# Patient Record
Sex: Female | Born: 1965 | Race: Black or African American | Hispanic: No | Marital: Single | Smoking: Never smoker
Health system: Southern US, Community
[De-identification: ages and names within clinical notes are randomized; demographics above are authoritative.]

## PROBLEM LIST (undated history)

## (undated) HISTORY — PX: TONSILLECTOMY: SUR1361

## (undated) HISTORY — PX: OVARY SURGERY: SHX727

---

## 2001-12-25 ENCOUNTER — Other Ambulatory Visit: Admission: RE | Admit: 2001-12-25 | Discharge: 2001-12-25 | Payer: Self-pay | Admitting: Gynecology

## 2002-11-10 ENCOUNTER — Other Ambulatory Visit: Admission: RE | Admit: 2002-11-10 | Discharge: 2002-11-10 | Payer: Self-pay | Admitting: Obstetrics and Gynecology

## 2004-03-03 ENCOUNTER — Other Ambulatory Visit: Admission: RE | Admit: 2004-03-03 | Discharge: 2004-03-03 | Payer: Self-pay | Admitting: Obstetrics and Gynecology

## 2004-03-17 ENCOUNTER — Ambulatory Visit (HOSPITAL_COMMUNITY): Admission: RE | Admit: 2004-03-17 | Discharge: 2004-03-17 | Payer: Self-pay | Admitting: Obstetrics and Gynecology

## 2004-03-31 ENCOUNTER — Ambulatory Visit (HOSPITAL_COMMUNITY): Admission: RE | Admit: 2004-03-31 | Discharge: 2004-03-31 | Payer: Self-pay | Admitting: Obstetrics and Gynecology

## 2004-03-31 ENCOUNTER — Encounter (INDEPENDENT_AMBULATORY_CARE_PROVIDER_SITE_OTHER): Payer: Self-pay | Admitting: *Deleted

## 2004-09-13 ENCOUNTER — Encounter (INDEPENDENT_AMBULATORY_CARE_PROVIDER_SITE_OTHER): Payer: Self-pay | Admitting: *Deleted

## 2004-09-13 ENCOUNTER — Ambulatory Visit (HOSPITAL_COMMUNITY): Admission: RE | Admit: 2004-09-13 | Discharge: 2004-09-13 | Payer: Self-pay | Admitting: Obstetrics and Gynecology

## 2005-12-25 ENCOUNTER — Ambulatory Visit (HOSPITAL_COMMUNITY): Admission: RE | Admit: 2005-12-25 | Discharge: 2005-12-25 | Payer: Self-pay | Admitting: Obstetrics and Gynecology

## 2007-03-08 ENCOUNTER — Encounter: Admission: RE | Admit: 2007-03-08 | Discharge: 2007-03-08 | Payer: Self-pay | Admitting: Physician Assistant

## 2008-07-10 ENCOUNTER — Ambulatory Visit (HOSPITAL_COMMUNITY): Admission: RE | Admit: 2008-07-10 | Discharge: 2008-07-10 | Payer: Self-pay | Admitting: Obstetrics and Gynecology

## 2008-10-22 ENCOUNTER — Ambulatory Visit (HOSPITAL_COMMUNITY): Admission: RE | Admit: 2008-10-22 | Discharge: 2008-10-22 | Payer: Self-pay | Admitting: Gastroenterology

## 2010-02-05 ENCOUNTER — Encounter: Payer: Self-pay | Admitting: Obstetrics and Gynecology

## 2010-04-25 LAB — CBC
MCHC: 33.7 g/dL (ref 30.0–36.0)
MCV: 87.3 fL (ref 78.0–100.0)
RBC: 4.27 MIL/uL (ref 3.87–5.11)
RDW: 13.8 % (ref 11.5–15.5)

## 2010-04-25 LAB — PREGNANCY, URINE: Preg Test, Ur: NEGATIVE

## 2010-05-31 NOTE — Op Note (Signed)
NAME:  Leah Hanson, Leah Hanson                 ACCOUNT NO.:  [id number]   MEDICAL RECORD NO.:  [id number]          PATIENT TYPE:  AMB   LOCATION:  SDC                           FACILITY:  [facility]   PHYSICIAN:  Michelle L. Grewal, M.D.DATE OF BIRTH:  11/26/1965   DATE OF PROCEDURE:  07/10/2008  DATE OF DISCHARGE:  07/10/2008                               OPERATIVE REPORT   PREOPERATIVE DIAGNOSIS:  Right lower quadrant pain.   POSTOPERATIVE DIAGNOSES:  Right lower quadrant pain and uterine fibroids  postop.   PROCEDURE:  Diagnostic laparoscopy and lysis of adhesion.   SURGEON:  Michelle L. Vincente Poli, MD   ANESTHESIA:  General.   FINDINGS:  1. Peritoneal inclusion cyst in the pelvis.  2. Uterine fibroids.  3. One adhesions between the right ovary and tube which was lysed.   PATHOLOGY:  None.   ESTIMATED BLOOD LOSS:  Minimal.   COMPLICATIONS:  None.   PROCEDURE:  The patient was taken to the operating room after informed  consent was obtained.  She was then prepped and draped in the usual  sterile fashion after she was intubated.  A time-out was performed.  A  uterine manipulator was inserted.  The bladder was emptied using an in-  and-out catheter.  The attention was turned to the abdomen.  A small  infraumbilical incision was made after local was infiltrated and the  Veress needle was inserted.  Pneumoperitoneum was then performed.  The  Veress needle was removed.  The 11-mm trocar was inserted.  The  laparoscope was introduced through the trocar sheaths.  The patient was  placed in Trendelenburg position.  Upper abdomen appeared normal.  There  were no omental adhesions.  The bowel appeared normal.  Left ovary and  tube was absent.  Her uterus was enlarged because she had fibroids.  A  secondary trocar was inserted using a 5-mm trocar under direct  visualization.  This was inserted suprapubically.  That way, I could  insert the way probe, so I could look in the cul-de-sac and look around  the right ovary.  I inspected the right tube and ovary carefully.  The  ovary itself looked normal and there was a small adhesion band between  the right fallopian tube and the right ovary which I cut without  difficulty.  This could potentially be a cause for her pain.  I then  looked in the cul-de-sac and she had some peritoneal inclusion cyst  there which I then drained using the Kleppinger by grasping that.  No  evidence of  endometriosis was noted.  The pneumoperitoneum was released.  The  trocars were removed.  The skin was closed with using 3-0 Vicryl  interrupted.  All sponge, lap, and instrument counts were correct x2.  All instruments were removed from the vagina.  Of course, the patient  was extubated and went to recovery room in stable condition.      Michelle L. Vincente Poli, M.D.  Electronically Signed     MLG/MEDQ  D:  07/10/2008  T:  07/11/2008  Job:  [id number]

## 2010-06-03 NOTE — Op Note (Signed)
NAME:  Leah Hanson, Leah Hanson                 ACCOUNT NO.:  [id number]   MEDICAL RECORD NO.:  [id number]          PATIENT TYPE:  AMB   LOCATION:  SDC                           FACILITY:  [facility]   PHYSICIAN:  Michelle L. Grewal, M.D.DATE OF BIRTH:  1965/07/12   DATE OF PROCEDURE:  09/13/2004  DATE OF DISCHARGE:                                 OPERATIVE REPORT   PREOPERATIVE DIAGNOSIS:  Pelvic pain, right ovarian cyst.   POSTOPERATIVE DIAGNOSIS:  Pelvic pain, right ovarian cyst.  Plus omental  adhesions.   PROCEDURE:  Diagnostic laparoscopy, drainage of right ovarian cyst, lysis of  adhesions, and peritoneal cyst biopsy.   SURGEON:  Marcelle Overlie, M.D.   ANESTHESIA:  General.   SPECIMENS:  Peritoneal biopsy.   ESTIMATED BLOOD LOSS:  Minimal.   COMPLICATIONS:  None.   DESCRIPTION OF PROCEDURE:  The patient is taken to the operating room.  She  is intubated without difficulty.  She is prepped and draped in the usual  sterile fashion.  Uterine manipulator is inserted into the uterus.  A small  infraumbilical incision was made.  The Veress needle was inserted and CO2  was inserted.  The Veress needle was removed and an 11 mm trocar was  inserted and the patient was placed in Trendelenburg position.  I then  inserted a 5 mm trocar suprapubically under direct visualization.  At this  point, I visualized the right tube and ovary.  The right ovary was enlarged  due to what appeared to be a hemorrhagic cyst.  No endometriosis seen.  The  uterus was slightly enlarged, but I could not discern any subserosal  fibroids.  There was a large amount of cobweb omental adhesions all down the  left upper quadrant and left lower quadrant down over the left pelvic side  wall which I then lysed those completely using the Gyrus with excellent  hemostasis.  The left ovary was absent as well as the left tube.  I then  drained the right ovarian cyst using Kleppingers, opened it up.  It was  hemorrhagic material  that was noted.  I burned the base of the cyst and  irrigated it.  It was dry.  I noticed that at the level of the left round  ligament insertion in the inguinal canal there were some peritoneal  inclusion cysts, but I did biopsy one of them as well just to make sure  there was no remnant of the ovary.  At the end of the procedure, hemostasis  was noted.  All adhesions were  lysed.  Again no endometriosis noted.  The pneumoperitoneum was released.  The trocars were removed from the abdominal cavity and incisions were closed  using Dermabond.  All needle, sponge, and instrument counts correct x2.  The  patient went to the recovery room in stable condition.      Michelle L. Vincente Poli, M.D.  Electronically Signed     MLG/MEDQ  D:  09/13/2004  T:  09/13/2004  Job:  [id number]

## 2013-06-03 ENCOUNTER — Other Ambulatory Visit: Payer: Self-pay | Admitting: Obstetrics and Gynecology

## 2013-06-24 ENCOUNTER — Other Ambulatory Visit: Payer: Self-pay | Admitting: Obstetrics and Gynecology

## 2013-06-24 DIAGNOSIS — R928 Other abnormal and inconclusive findings on diagnostic imaging of breast: Secondary | ICD-10-CM

## 2013-07-30 ENCOUNTER — Ambulatory Visit
Admission: RE | Admit: 2013-07-30 | Discharge: 2013-07-30 | Disposition: A | Discharge status: Home / Self Care | Payer: Commercial Indemnity | Source: Ambulatory Visit | Attending: Obstetrics and Gynecology | Admitting: Obstetrics and Gynecology

## 2013-07-30 DIAGNOSIS — R928 Other abnormal and inconclusive findings on diagnostic imaging of breast: Secondary | ICD-10-CM

## 2016-02-02 IMAGING — MG MM DIGITAL DIAGNOSTIC UNILAT*R*
4 series · 4 of 4 positions shown · non-contrast
Comparison: Baseline mammogram 06/03/2013

CLINICAL DATA: Screening callback for questioned right breast
calcifications at baseline mammography

EXAM:
DIGITAL DIAGNOSTIC  right MAMMOGRAM

[R CC]
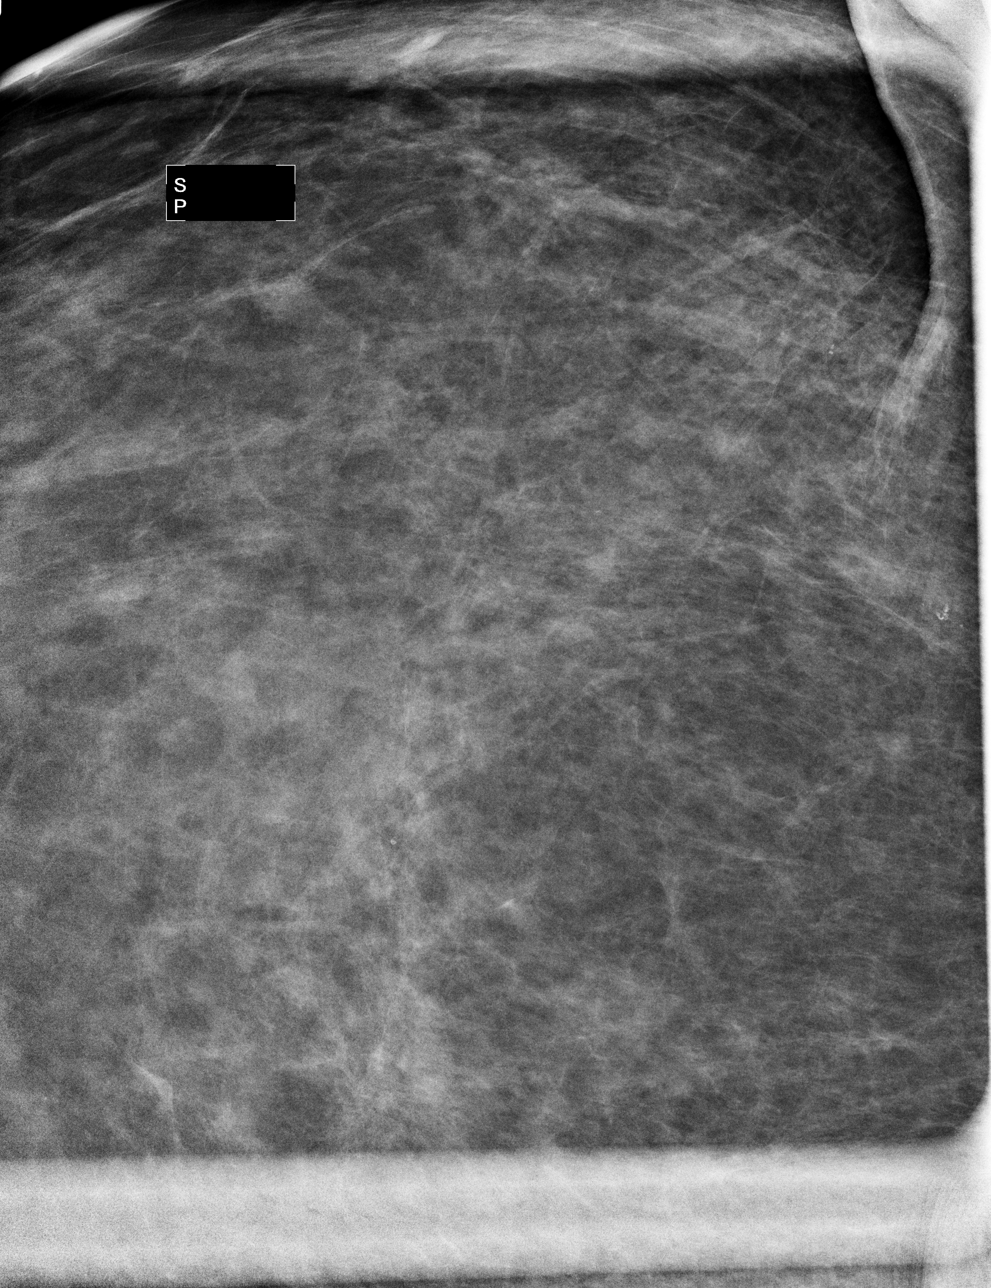

[R TAN]
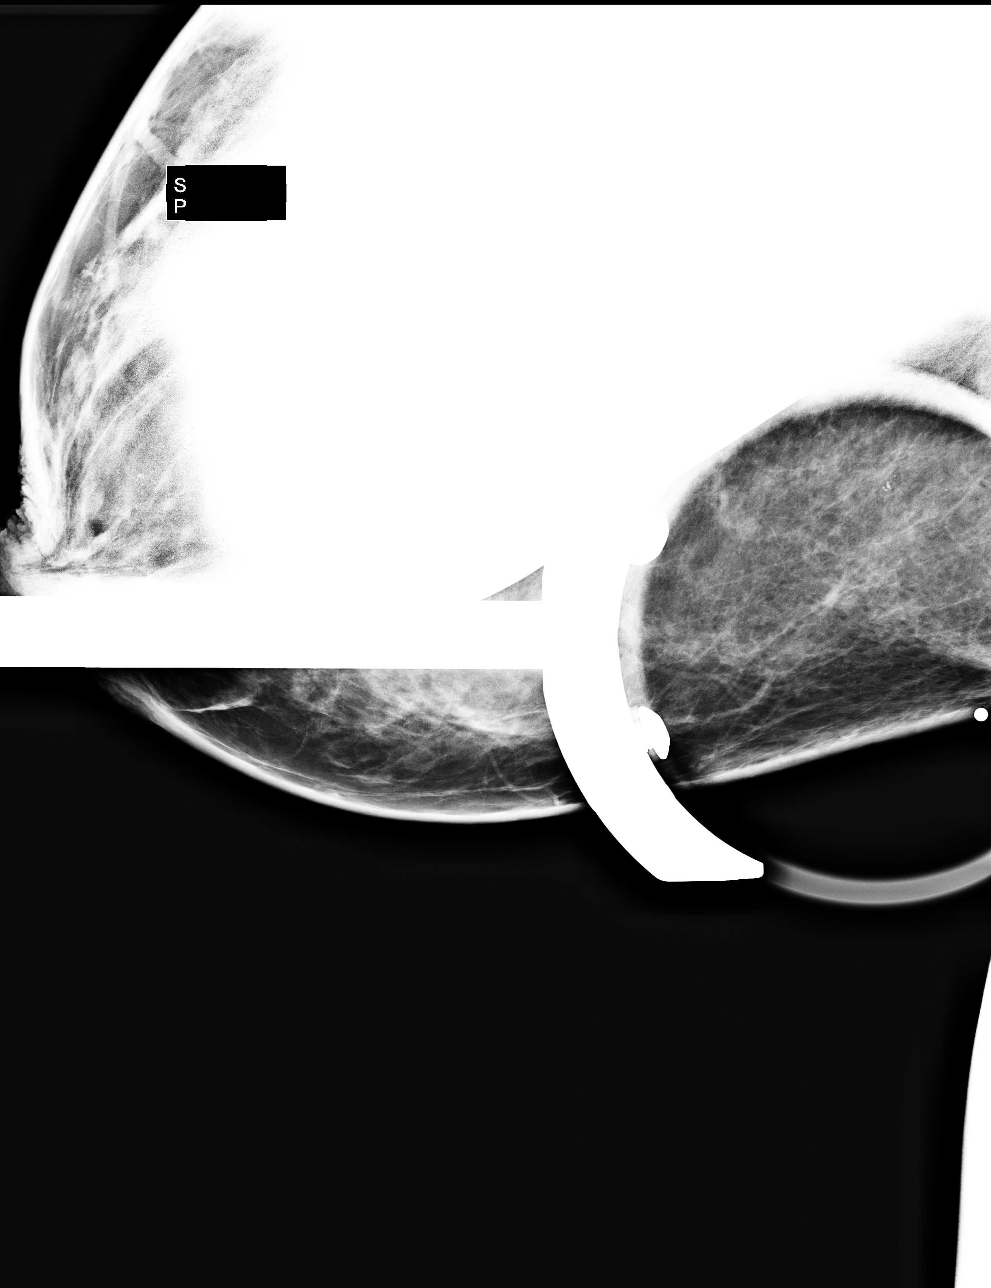

[R ML (1 of 2)]
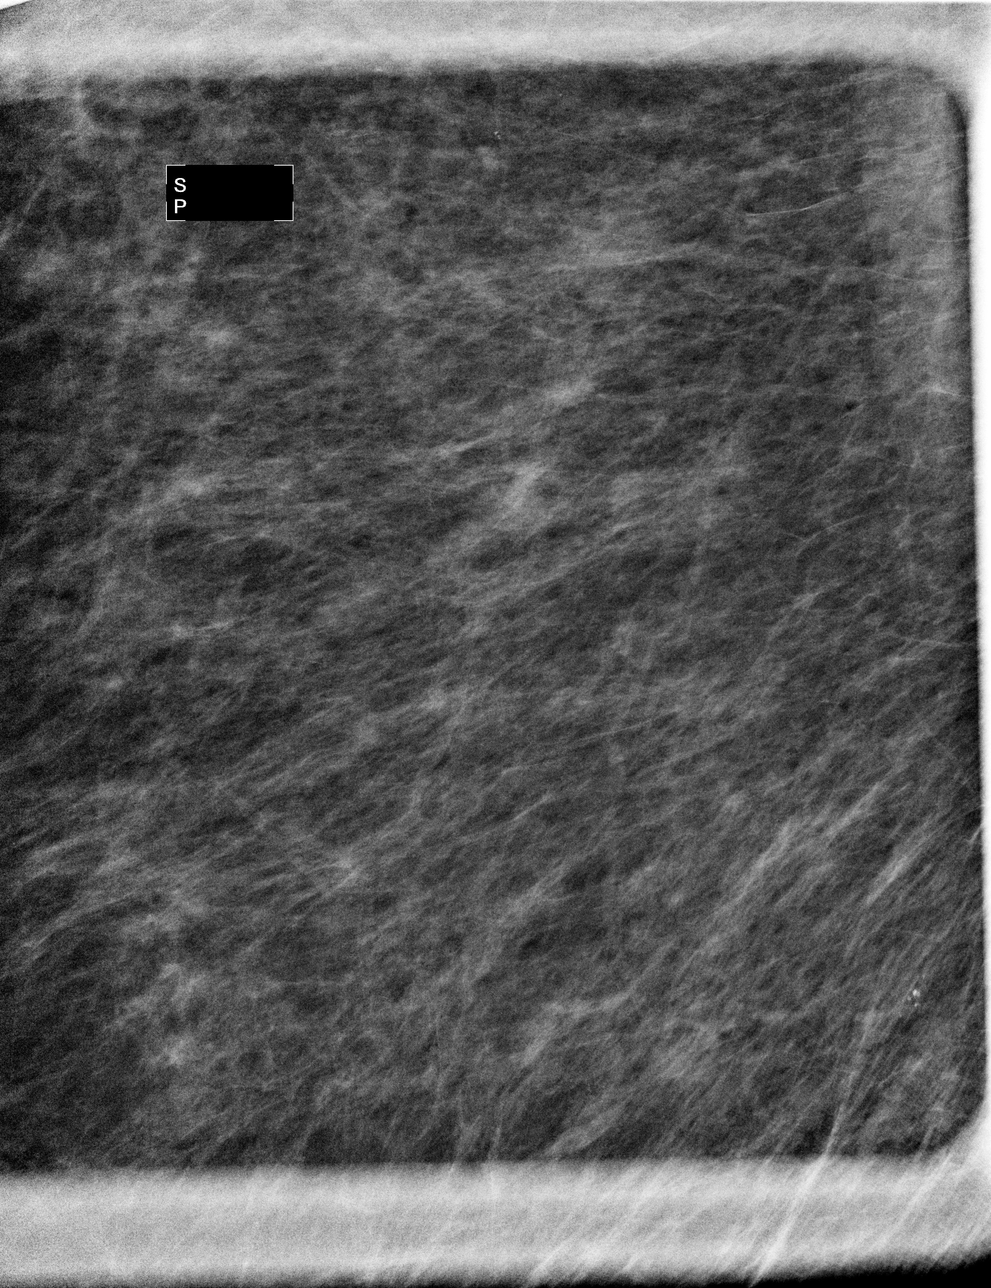

[R ML (2 of 2)]
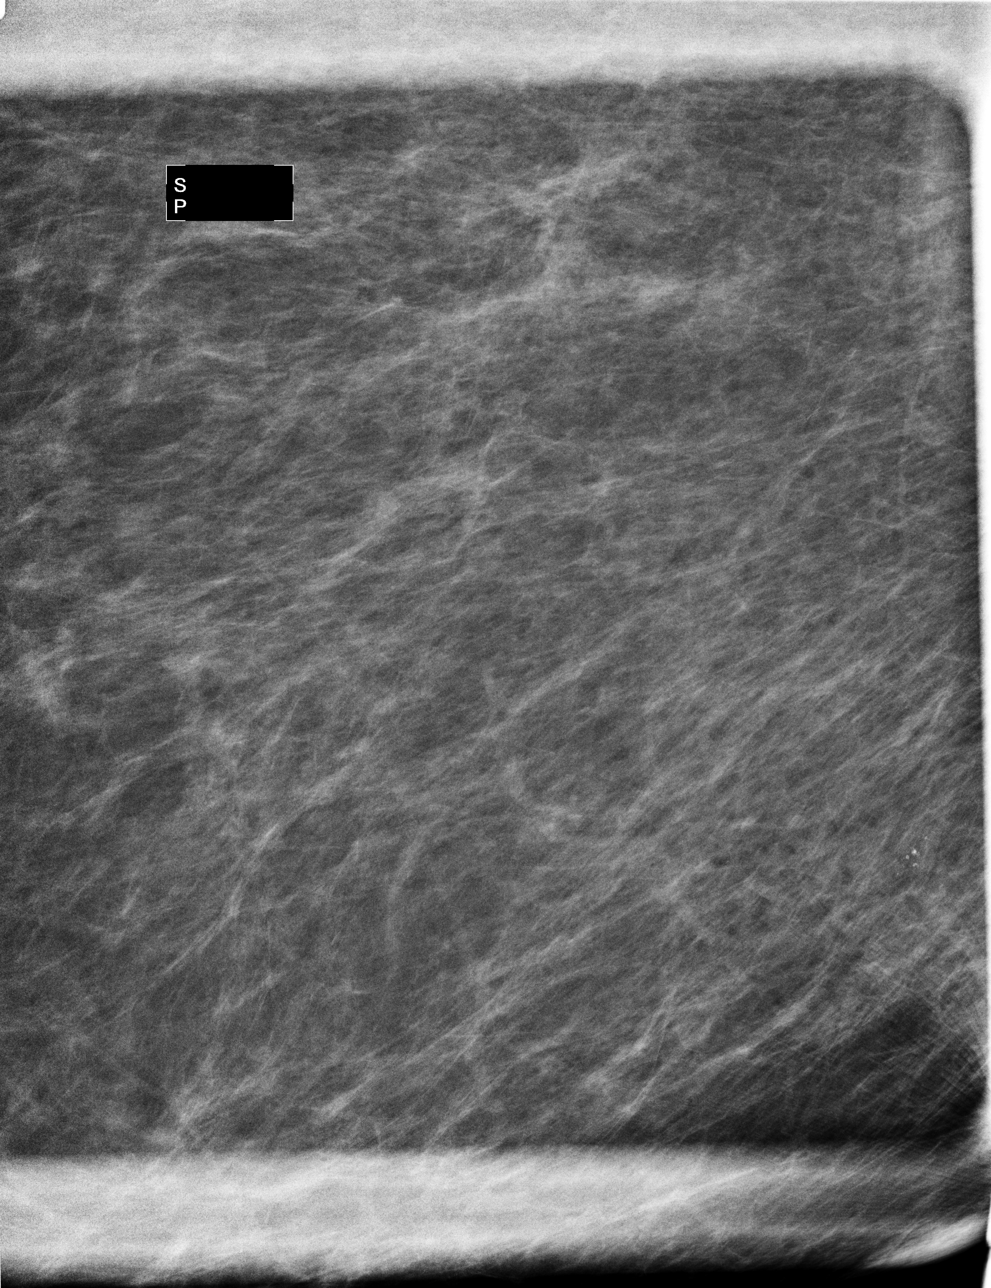

[4 of 4 positions shown; findings below may reference images not displayed]

ACR Breast Density Category c: The breast tissue is heterogeneously
dense, which may obscure small masses.
FINDINGS: Additional views confirm the presence of a cluster of round
calcifications in the right breast 6 o'clock location, most of which
demonstrate apparent lucent centers suggesting benignity. These were
not intradermal at dedicated imaging.
IMPRESSION: Probably benign right breast calcifications 6 o'clock location.
Options for short-term imaging followup and percutaneous core biopsy
were discussed and the patient chooses short-term followup imaging.

RECOMMENDATION:
Right diagnostic mammogram in 6 months

I have discussed the findings and recommendations with the patient.
Results were also provided in writing at the conclusion of the
visit. If applicable, a reminder letter will be sent to the patient
regarding the next appointment.

BI-RADS CATEGORY  3: Probably benign.

## 2016-08-09 ENCOUNTER — Encounter (HOSPITAL_BASED_OUTPATIENT_CLINIC_OR_DEPARTMENT_OTHER): Payer: Self-pay

## 2016-08-09 ENCOUNTER — Emergency Department (HOSPITAL_BASED_OUTPATIENT_CLINIC_OR_DEPARTMENT_OTHER)
Admission: EM | Admit: 2016-08-09 | Discharge: 2016-08-09 | Disposition: A | Discharge status: Home / Self Care | Payer: Commercial Managed Care - PPO | Attending: Emergency Medicine | Admitting: Emergency Medicine

## 2016-08-09 ENCOUNTER — Emergency Department (HOSPITAL_BASED_OUTPATIENT_CLINIC_OR_DEPARTMENT_OTHER): Payer: Commercial Managed Care - PPO

## 2016-08-09 DIAGNOSIS — N938 Other specified abnormal uterine and vaginal bleeding: Secondary | ICD-10-CM

## 2016-08-09 DIAGNOSIS — R1031 Right lower quadrant pain: Secondary | ICD-10-CM | POA: Diagnosis not present

## 2016-08-09 DIAGNOSIS — R9389 Abnormal findings on diagnostic imaging of other specified body structures: Secondary | ICD-10-CM

## 2016-08-09 DIAGNOSIS — D259 Leiomyoma of uterus, unspecified: Secondary | ICD-10-CM

## 2016-08-09 DIAGNOSIS — R102 Pelvic and perineal pain: Secondary | ICD-10-CM

## 2016-08-09 LAB — CBC
HEMATOCRIT: 33.7 % — AB (ref 36.0–46.0)
Hemoglobin: 11.2 g/dL — ABNORMAL LOW (ref 12.0–15.0)
MCH: 28.2 pg (ref 26.0–34.0)
MCHC: 33.2 g/dL (ref 30.0–36.0)
MCV: 84.9 fL (ref 78.0–100.0)
Platelets: 346 10*3/uL (ref 150–400)
RBC: 3.97 MIL/uL (ref 3.87–5.11)
RDW: 17.4 % — AB (ref 11.5–15.5)
WBC: 7.6 10*3/uL (ref 4.0–10.5)

## 2016-08-09 LAB — BASIC METABOLIC PANEL
ANION GAP: 8 (ref 5–15)
BUN: 10 mg/dL (ref 6–20)
CALCIUM: 8.8 mg/dL — AB (ref 8.9–10.3)
CO2: 28 mmol/L (ref 22–32)
Chloride: 103 mmol/L (ref 101–111)
Creatinine, Ser: 0.7 mg/dL (ref 0.44–1.00)
GFR calc Af Amer: >60 mL/min (ref >60)
GFR calc non Af Amer: >60 mL/min (ref >60)
GLUCOSE: 105 mg/dL — AB (ref 65–99)
Potassium: 4 mmol/L (ref 3.5–5.1)
Sodium: 139 mmol/L (ref 135–145)

## 2016-08-09 LAB — WET PREP, GENITAL
Sperm: NONE SEEN
Trich, Wet Prep: NONE SEEN
Yeast Wet Prep HPF POC: NONE SEEN

## 2016-08-09 LAB — PREGNANCY, URINE: Preg Test, Ur: NEGATIVE

## 2016-08-09 NOTE — ED Notes (Signed)
Pt resting.  Notified that ultrasound tech will be here at 9 am.  Pt agreed.

## 2016-08-09 NOTE — Discharge Instructions (Signed)
It was our pleasure to provide your ER care today - we hope that you feel better.  Take acetaminophen and/or ibuprofen as need for pain.  Your ultrasound was read as showing: IMPRESSION: 1. Thickened endometrium at 20 mm. If bleeding remains unresponsive to hormonal or medical therapy, focal lesion work-up with sonohysterogram should be considered. Endometrial biopsy should also be considered in pre-menopausal patients at high risk for endometrial carcinoma. (Ref: Radiological Reasoning: Algorithmic Workup of Abnormal Vaginal Bleeding with Endovaginal Sonography and Sonohysterography. AJR 2008; 242:A83-41) 2. Uterine fibroids as described.  For your prolonged bleeding, thickened endometrium and fibroids noted on ultrasound - follow up with your ob/gyn doctor in the next 1-2 weeks - call office to arrange appointment.  Return to ER if worse, new symptoms, fevers, new or severe pain, other concern.

## 2016-08-09 NOTE — ED Notes (Signed)
Pt states she has been having right lower quad abdominal pain for over 3 months, worse for last three weeks.  Pt has been having vaginal bleeding for two years.  Left ovary has been removed.

## 2016-08-09 NOTE — ED Notes (Signed)
Pt returned from ultrasound

## 2016-08-09 NOTE — ED Notes (Signed)
Patient transported to Ultrasound 

## 2016-08-09 NOTE — ED Provider Notes (Signed)
[facility] Provider Note   CSN: [id number] Arrival date & time: 08/09/16  4332     History   Chief Complaint Chief Complaint  Patient presents with  . Vaginal Bleeding    HPI Leah Hanson is a 51 y.o. female.  Patient c/o persistent vaginal bleeding for past 3 weeks. Uses pads, changes every few hours. Hx irregular bleeding/periods for past 2 years. Also c/o pain right lower abdomen, for past months. Intermittent. At times, worse when having vaginal bleeding. Hx ovarian cysts. No hx endometriosis or fibroids.  No birth control. No blood thinner use. No weakness/faintness. No fever or chills. No dysuria or hematuria. No rectal bleeding.    The history is provided by the patient.  Vaginal Bleeding  Primary symptoms include vaginal bleeding.  Primary symptoms include no dysuria. Pertinent negatives include no abdominal pain and no vomiting.    History reviewed. No pertinent past medical history.  There are no active problems to display for this patient.   Past Surgical History:  Procedure Laterality Date  . CESAREAN SECTION    . OVARY SURGERY    . TONSILLECTOMY      OB History    No data available       Home Medications    Prior to Admission medications   Not on File    Family History No family history on file.  Social History Social History  Substance Use Topics  . Smoking status: Never Smoker  . Smokeless tobacco: Not on file  . Alcohol use Not on file     Allergies   Patient has no known allergies.   Review of Systems Review of Systems  Constitutional: Negative for fever.  HENT: Negative for sore throat.   Eyes: Negative for redness.  Respiratory: Negative for shortness of breath.   Cardiovascular: Negative for chest pain.  Gastrointestinal: Negative for abdominal pain and vomiting.  Genitourinary: Positive for vaginal bleeding. Negative for dysuria, flank pain and vaginal discharge.  Musculoskeletal: Negative for back pain and  neck pain.  Skin: Negative for rash.  Neurological: Negative for headaches.  Hematological: Does not bruise/bleed easily.  Psychiatric/Behavioral: Negative for confusion.     Physical Exam Updated Vital Signs BP 134/90 (BP Location: Right Arm)   Pulse 86   Temp 98.3 F (36.8 C) (Oral)   Resp 16   Ht 1.6 m (5\' 3" )   Wt 104.3 kg (230 lb)   LMP 07/19/2016   SpO2 100%   BMI 40.74 kg/m   Physical Exam  Constitutional: She appears well-developed and well-nourished. No distress.  HENT:  Mouth/Throat: Oropharynx is clear and moist.  Eyes: Conjunctivae are normal. No scleral icterus.  Neck: Neck supple. No tracheal deviation present.  Cardiovascular: Normal rate, regular rhythm, normal heart sounds and intact distal pulses.  Exam reveals no gallop and no friction rub.   No murmur heard. Pulmonary/Chest: Effort normal and breath sounds normal. No respiratory distress.  Abdominal: Soft. Normal appearance and bowel sounds are normal. She exhibits no distension and no mass. There is no tenderness. There is no rebound and no guarding. No hernia.  Genitourinary:  Genitourinary Comments: No cva tenderness. Normal external gu exam. On pelvic exam, there is scant bleeding. There is no discharge or malodor. Cervix closed. No cmt. No adx masses or focal tenderness.   Musculoskeletal: She exhibits no edema or tenderness.  Neurological: She is alert.  Skin: Skin is warm and dry. No rash noted. She is not diaphoretic.  Psychiatric: She  has a normal mood and affect.  Nursing note and vitals reviewed.    ED Treatments / Results  Labs (all labs ordered are listed, but only abnormal results are displayed) Results for orders placed or performed during the hospital encounter of 08/09/16  Wet prep, genital  Result Value Ref Range   Yeast Wet Prep HPF POC NONE SEEN NONE SEEN   Trich, Wet Prep NONE SEEN NONE SEEN   Clue Cells Wet Prep HPF POC PRESENT (A) NONE SEEN   WBC, Wet Prep HPF POC FEW (A)  NONE SEEN   Sperm NONE SEEN   Pregnancy, urine  Result Value Ref Range   Preg Test, Ur NEGATIVE NEGATIVE  CBC  Result Value Ref Range   WBC 7.6 4.0 - 10.5 K/uL   RBC 3.97 3.87 - 5.11 MIL/uL   Hemoglobin 11.2 (L) 12.0 - 15.0 g/dL   HCT 33.7 (L) 36.0 - 46.0 %   MCV 84.9 78.0 - 100.0 fL   MCH 28.2 26.0 - 34.0 pg   MCHC 33.2 30.0 - 36.0 g/dL   RDW 17.4 (H) 11.5 - 15.5 %   Platelets 346 150 - 400 K/uL  Basic metabolic panel  Result Value Ref Range   Sodium 139 135 - 145 mmol/L   Potassium 4.0 3.5 - 5.1 mmol/L   Chloride 103 101 - 111 mmol/L   CO2 28 22 - 32 mmol/L   Glucose, Bld 105 (H) 65 - 99 mg/dL   BUN 10 6 - 20 mg/dL   Creatinine, Ser 0.70 0.44 - 1.00 mg/dL   Calcium 8.8 (L) 8.9 - 10.3 mg/dL   GFR calc non Af Amer >60 >60 mL/min   GFR calc Af Amer >60 >60 mL/min   Anion gap 8 5 - 15   US Transvaginal Non-ob  Result Date: 08/09/2016 CLINICAL DATA:  Dysfunctional uterine bleeding. Right adnexal pain for 3 weeks. EXAM: TRANSABDOMINAL AND TRANSVAGINAL ULTRASOUND OF PELVIS TECHNIQUE: Both transabdominal and transvaginal ultrasound examinations of the pelvis were performed. Transabdominal technique was performed for global imaging of the pelvis including uterus, ovaries, adnexal regions, and pelvic cul-de-sac. It was necessary to proceed with endovaginal exam following the transabdominal exam to visualize the endometrium and right ovary. COMPARISON:  Ultrasound dated 09/29/2008 FINDINGS: Uterus Measurements: 13.2 x 6.2 x 8.6 cm, slightly larger than on the prior exam. 4.7 cm fibroid in the uterine fundus anterior and to the left. 4.5 cm fibroid in the uterine body posteriorly into the right. The fibroids were not well-defined on the prior study. -73) Endometrium Thickness: 20 mm. If bleeding remains unresponsive to hormonal or medical therapy, focal lesion work-up with sonohysterogram should be considered. Endometrial biopsy should also be considered in pre-menopausal patients at high risk  for endometrial carcinoma. (Ref: Radiological Reasoning: Algorithmic Workup of Abnormal Vaginal Bleeding with Endovaginal Sonography and Sonohysterography. AJR 2008; 191:S68 Right ovary Measurements: 3.9 x 1.8 x 2.8 cm. Normal appearance/no adnexal mass. Left ovary Measurements: Removed according to prior report. Other findings No abnormal free fluid. IMPRESSION: 1. Thickened endometrium at 20 mm. If bleeding remains unresponsive to hormonal or medical therapy, focal lesion work-up with sonohysterogram should be considered. Endometrial biopsy should also be considered in pre-menopausal patients at high risk for endometrial carcinoma. (Ref: Radiological Reasoning: Algorithmic Workup of Abnormal Vaginal Bleeding with Endovaginal Sonography and Sonohysterography. AJR 2008; 017:P10-25) 2. Uterine fibroids as described. Electronically Signed   By: Lorriane Shire M.D.   On: 08/09/2016 10:20   US Pelvis Complete  Result Date: 08/09/2016  CLINICAL DATA:  Dysfunctional uterine bleeding. Right adnexal pain for 3 weeks. EXAM: TRANSABDOMINAL AND TRANSVAGINAL ULTRASOUND OF PELVIS TECHNIQUE: Both transabdominal and transvaginal ultrasound examinations of the pelvis were performed. Transabdominal technique was performed for global imaging of the pelvis including uterus, ovaries, adnexal regions, and pelvic cul-de-sac. It was necessary to proceed with endovaginal exam following the transabdominal exam to visualize the endometrium and right ovary. COMPARISON:  Ultrasound dated 09/29/2008 FINDINGS: Uterus Measurements: 13.2 x 6.2 x 8.6 cm, slightly larger than on the prior exam. 4.7 cm fibroid in the uterine fundus anterior and to the left. 4.5 cm fibroid in the uterine body posteriorly into the right. The fibroids were not well-defined on the prior study. -73) Endometrium Thickness: 20 mm. If bleeding remains unresponsive to hormonal or medical therapy, focal lesion work-up with sonohysterogram should be considered. Endometrial  biopsy should also be considered in pre-menopausal patients at high risk for endometrial carcinoma. (Ref: Radiological Reasoning: Algorithmic Workup of Abnormal Vaginal Bleeding with Endovaginal Sonography and Sonohysterography. AJR 2008; 191:S68 Right ovary Measurements: 3.9 x 1.8 x 2.8 cm. Normal appearance/no adnexal mass. Left ovary Measurements: Removed according to prior report. Other findings No abnormal free fluid. IMPRESSION: 1. Thickened endometrium at 20 mm. If bleeding remains unresponsive to hormonal or medical therapy, focal lesion work-up with sonohysterogram should be considered. Endometrial biopsy should also be considered in pre-menopausal patients at high risk for endometrial carcinoma. (Ref: Radiological Reasoning: Algorithmic Workup of Abnormal Vaginal Bleeding with Endovaginal Sonography and Sonohysterography. AJR 2008; 329:J18-84) 2. Uterine fibroids as described. Electronically Signed   By: Lorriane Shire M.D.   On: 08/09/2016 10:20    EKG  EKG Interpretation None       Radiology US Transvaginal Non-ob  Result Date: 08/09/2016 CLINICAL DATA:  Dysfunctional uterine bleeding. Right adnexal pain for 3 weeks. EXAM: TRANSABDOMINAL AND TRANSVAGINAL ULTRASOUND OF PELVIS TECHNIQUE: Both transabdominal and transvaginal ultrasound examinations of the pelvis were performed. Transabdominal technique was performed for global imaging of the pelvis including uterus, ovaries, adnexal regions, and pelvic cul-de-sac. It was necessary to proceed with endovaginal exam following the transabdominal exam to visualize the endometrium and right ovary. COMPARISON:  Ultrasound dated 09/29/2008 FINDINGS: Uterus Measurements: 13.2 x 6.2 x 8.6 cm, slightly larger than on the prior exam. 4.7 cm fibroid in the uterine fundus anterior and to the left. 4.5 cm fibroid in the uterine body posteriorly into the right. The fibroids were not well-defined on the prior study. -73) Endometrium Thickness: 20 mm. If  bleeding remains unresponsive to hormonal or medical therapy, focal lesion work-up with sonohysterogram should be considered. Endometrial biopsy should also be considered in pre-menopausal patients at high risk for endometrial carcinoma. (Ref: Radiological Reasoning: Algorithmic Workup of Abnormal Vaginal Bleeding with Endovaginal Sonography and Sonohysterography. AJR 2008; 191:S68 Right ovary Measurements: 3.9 x 1.8 x 2.8 cm. Normal appearance/no adnexal mass. Left ovary Measurements: Removed according to prior report. Other findings No abnormal free fluid. IMPRESSION: 1. Thickened endometrium at 20 mm. If bleeding remains unresponsive to hormonal or medical therapy, focal lesion work-up with sonohysterogram should be considered. Endometrial biopsy should also be considered in pre-menopausal patients at high risk for endometrial carcinoma. (Ref: Radiological Reasoning: Algorithmic Workup of Abnormal Vaginal Bleeding with Endovaginal Sonography and Sonohysterography. AJR 2008; 166:A63-01) 2. Uterine fibroids as described. Electronically Signed   By: Lorriane Shire M.D.   On: 08/09/2016 10:20   US Pelvis Complete  Result Date: 08/09/2016 CLINICAL DATA:  Dysfunctional uterine bleeding. Right adnexal pain for 3 weeks. EXAM:  TRANSABDOMINAL AND TRANSVAGINAL ULTRASOUND OF PELVIS TECHNIQUE: Both transabdominal and transvaginal ultrasound examinations of the pelvis were performed. Transabdominal technique was performed for global imaging of the pelvis including uterus, ovaries, adnexal regions, and pelvic cul-de-sac. It was necessary to proceed with endovaginal exam following the transabdominal exam to visualize the endometrium and right ovary. COMPARISON:  Ultrasound dated 09/29/2008 FINDINGS: Uterus Measurements: 13.2 x 6.2 x 8.6 cm, slightly larger than on the prior exam. 4.7 cm fibroid in the uterine fundus anterior and to the left. 4.5 cm fibroid in the uterine body posteriorly into the right. The fibroids were not  well-defined on the prior study. -73) Endometrium Thickness: 20 mm. If bleeding remains unresponsive to hormonal or medical therapy, focal lesion work-up with sonohysterogram should be considered. Endometrial biopsy should also be considered in pre-menopausal patients at high risk for endometrial carcinoma. (Ref: Radiological Reasoning: Algorithmic Workup of Abnormal Vaginal Bleeding with Endovaginal Sonography and Sonohysterography. AJR 2008; 191:S68 Right ovary Measurements: 3.9 x 1.8 x 2.8 cm. Normal appearance/no adnexal mass. Left ovary Measurements: Removed according to prior report. Other findings No abnormal free fluid. IMPRESSION: 1. Thickened endometrium at 20 mm. If bleeding remains unresponsive to hormonal or medical therapy, focal lesion work-up with sonohysterogram should be considered. Endometrial biopsy should also be considered in pre-menopausal patients at high risk for endometrial carcinoma. (Ref: Radiological Reasoning: Algorithmic Workup of Abnormal Vaginal Bleeding with Endovaginal Sonography and Sonohysterography. AJR 2008; 379:K24-09) 2. Uterine fibroids as described. Electronically Signed   By: Lorriane Shire M.D.   On: 08/09/2016 10:20    Procedures Procedures (including critical care time)  Medications Ordered in ED Medications - No data to display   Initial Impression / Assessment and Plan / ED Course  I have reviewed the triage vital signs and the nursing notes.  Pertinent labs & imaging results that were available during my care of the patient were reviewed by me and considered in my medical decision making (see chart for details).  Labs.   Reviewed nursing notes and prior charts for additional history.   Recheck, discussed u/s findings w pt and need for close ob/gyn follow up - pt sees DrGrewal - will have f/u there.  Recheck abd soft non tender. No fevers   Pt appears stable for d/c.      Final Clinical Impressions(s) / ED Diagnoses   Final diagnoses:    None    New Prescriptions New Prescriptions   No medications on file     Lajean Saver, MD 08/09/16 1041

## 2016-08-09 NOTE — ED Triage Notes (Signed)
Pt reports RLQ abd pain x 1 month and vaginal bleeding that began about 3 weeks ago. Pt has had irregular periods since 2016. Pt rates pain 8/10.

## 2016-08-10 LAB — GC/CHLAMYDIA PROBE AMP (~~LOC~~) NOT AT ARMC
CHLAMYDIA, DNA PROBE: NEGATIVE
Neisseria Gonorrhea: NEGATIVE

## 2018-09-10 IMAGING — US US PELVIS COMPLETE
1 series · 13 of 25 positions shown · non-contrast
Comparison: Ultrasound dated 09/29/2008

CLINICAL DATA: Dysfunctional uterine bleeding. Right adnexal pain
for 3 weeks.

EXAM:
TRANSABDOMINAL AND TRANSVAGINAL ULTRASOUND OF PELVIS
TECHNIQUE: Both transabdominal and transvaginal ultrasound examinations of the
pelvis were performed. Transabdominal technique was performed for
global imaging of the pelvis including uterus, ovaries, adnexal
regions, and pelvic cul-de-sac. It was necessary to proceed with
endovaginal exam following the transabdominal exam to visualize the
endometrium and right ovary.

[Series 1: us pelvis complete · 0.24mm/px · 13 of 39 slices shown]
[im 1/39]
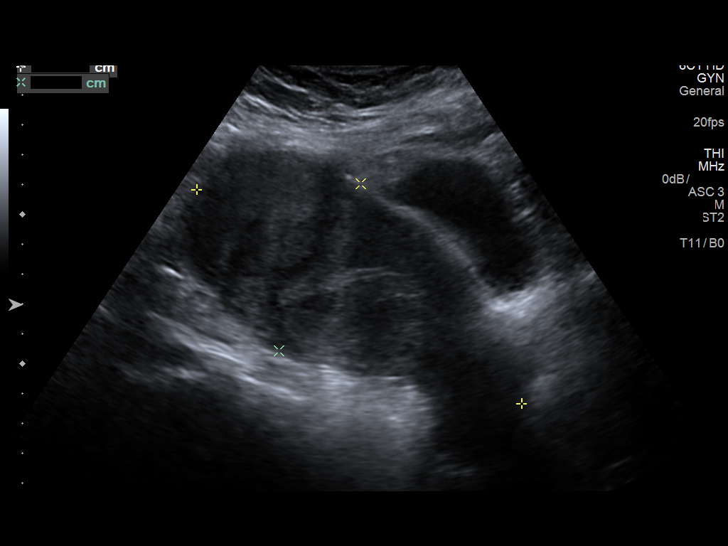
[im 4/39]
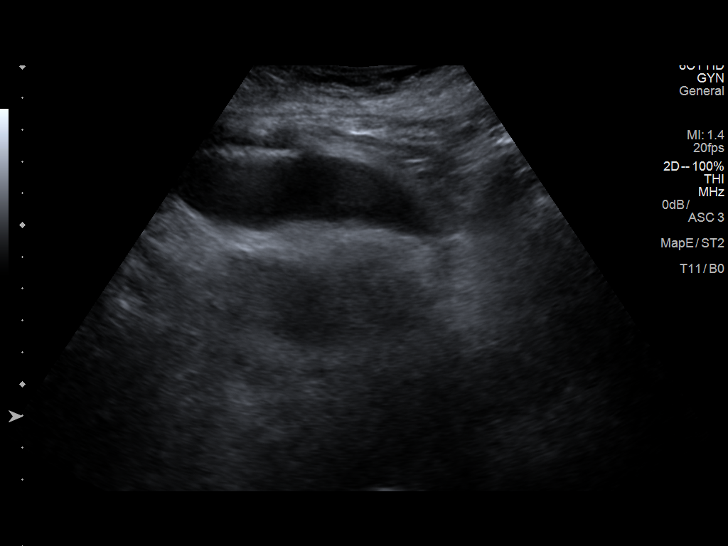
[im 7/39]
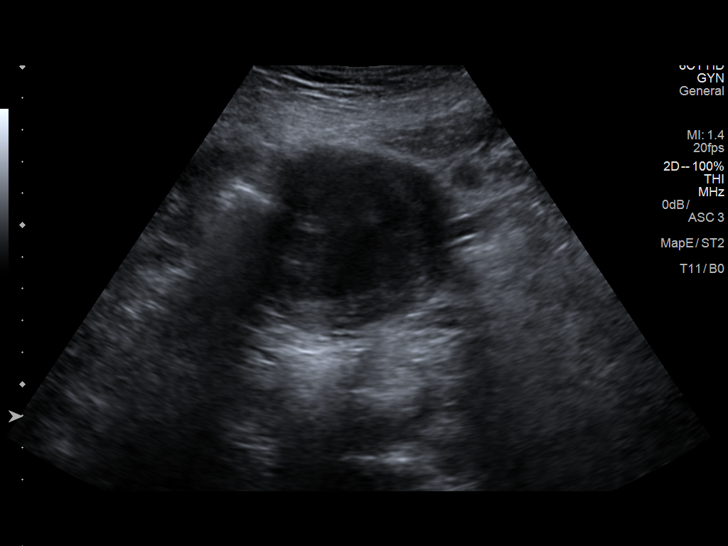
[im 10/39]
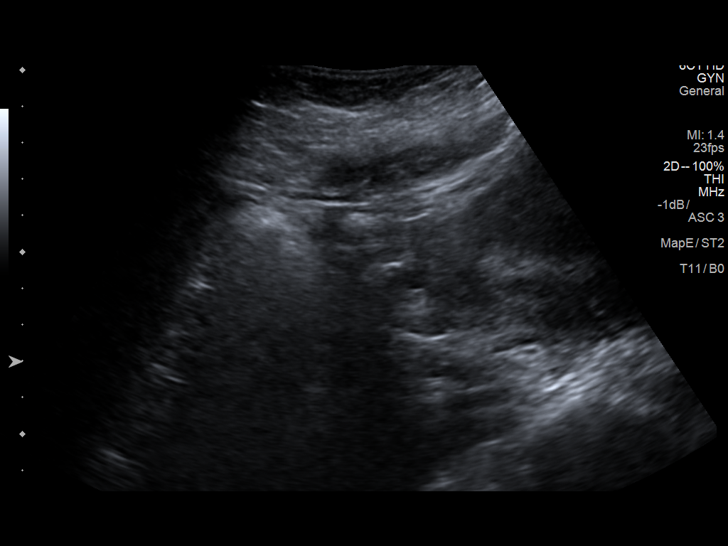
[im 13/39]
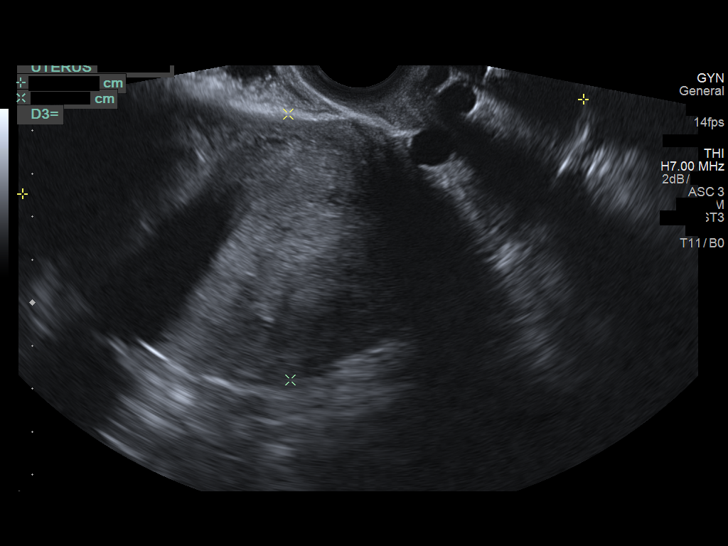
[im 16/39]
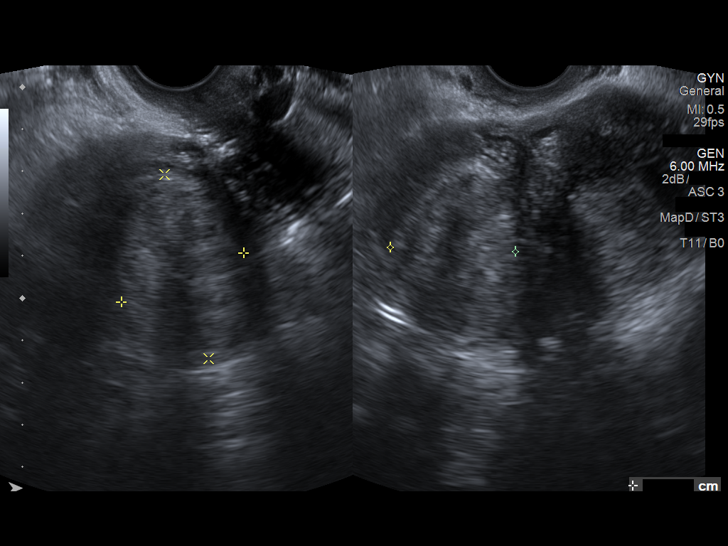
[im 20/39]
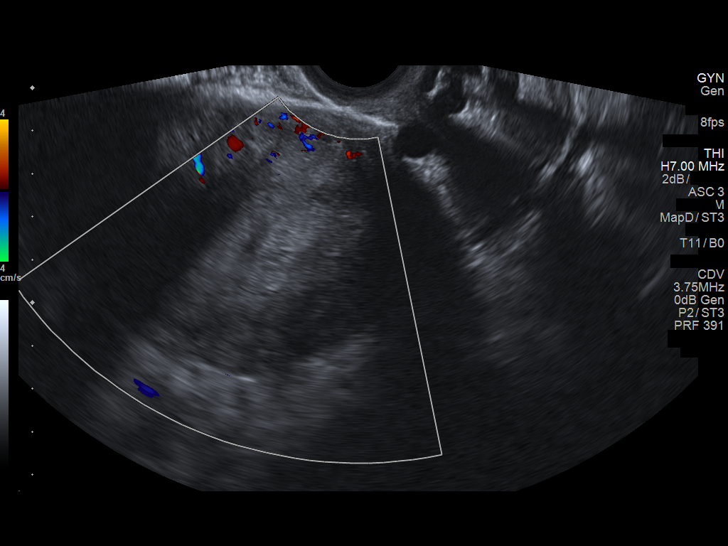
[im 23/39]
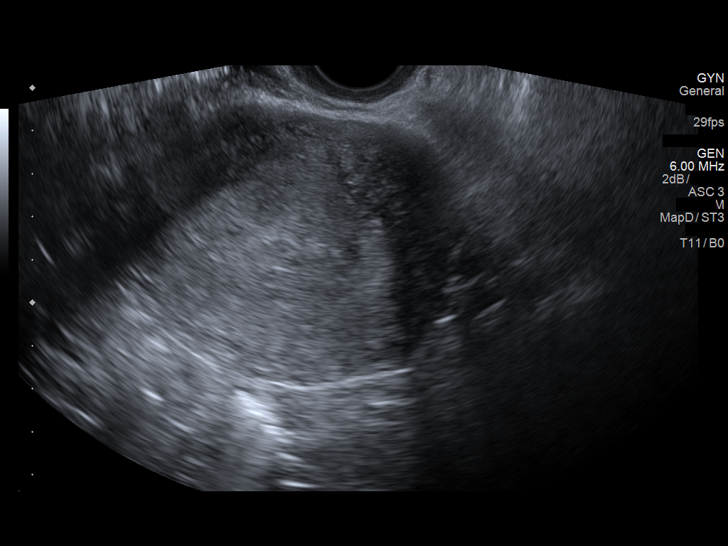
[im 26/39]
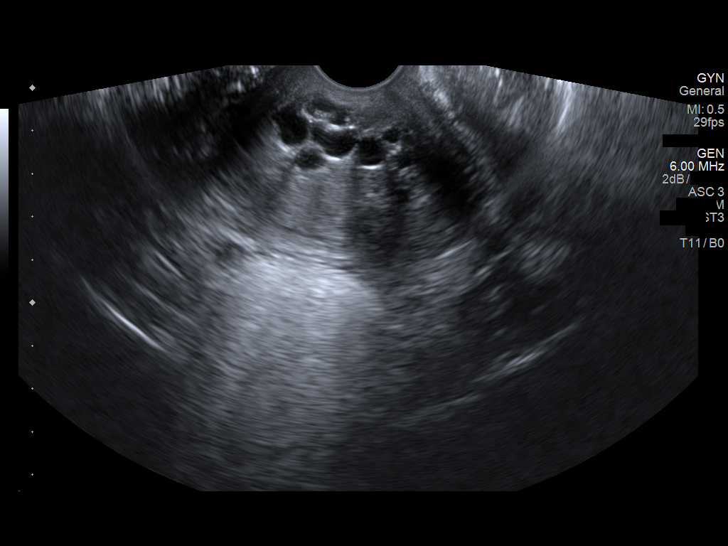
[im 29/39]
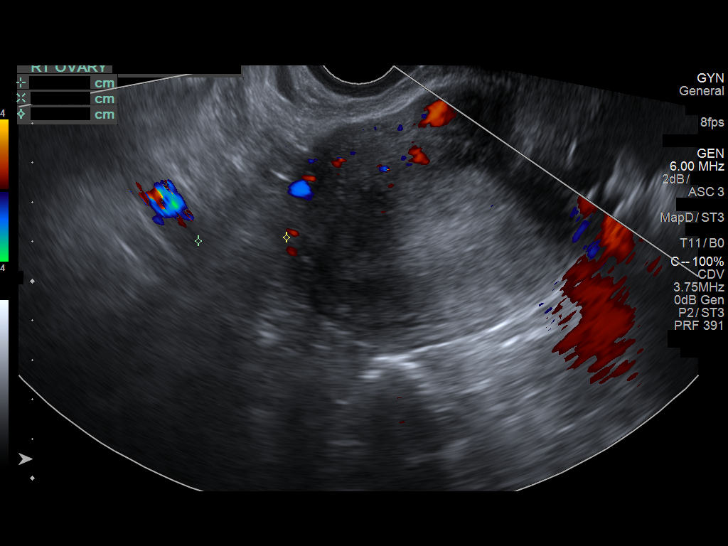
[im 32/39]
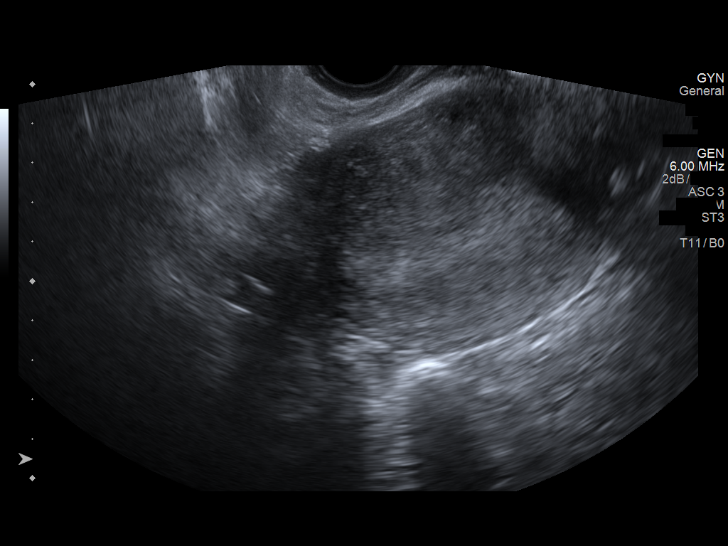
[im 35/39]
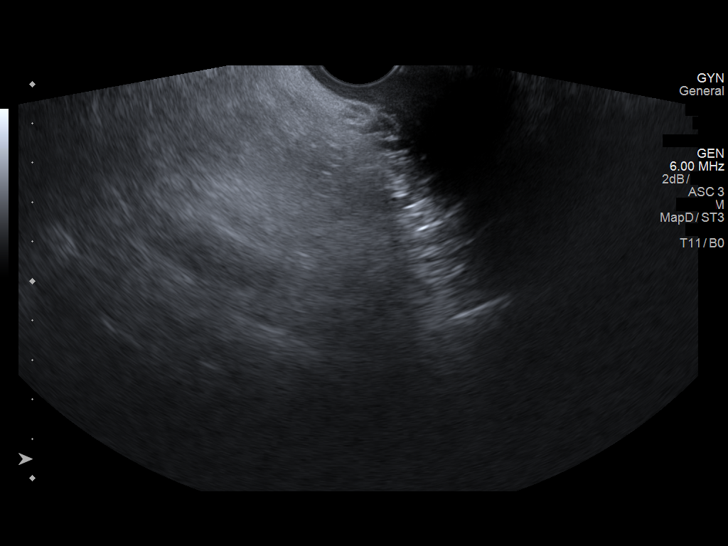
[im 39/39]
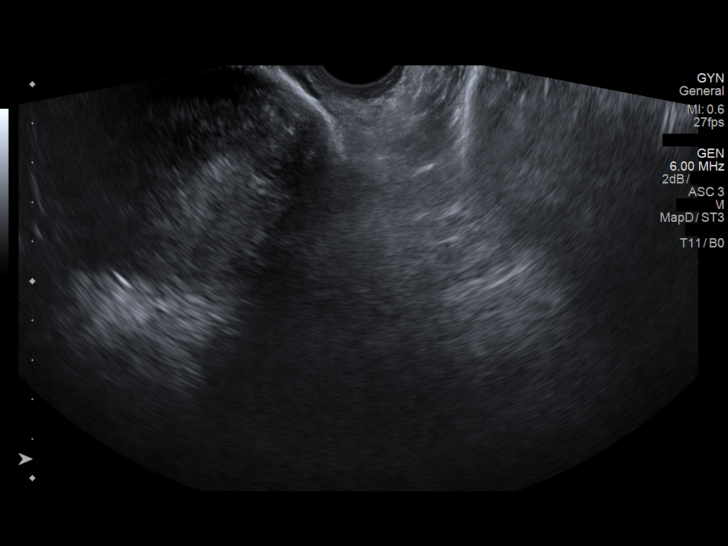

[13 of 25 positions shown; findings below may reference images not displayed]

FINDINGS: Uterus

Measurements: 13.2 x 6.2 x 8.6 cm, slightly larger than on the prior
exam.. 4.7 cm fibroid in the uterine fundus anterior and to the
left. 4.5 cm fibroid in the uterine body posteriorly into the right.
The fibroids were not well-defined on the prior study. -[id number])

Endometrium

Thickness: 20 mm. If bleeding remains unresponsive to hormonal or
medical therapy, focal lesion work-up with sonohysterogram should be
considered. Endometrial biopsy should also be considered in
pre-menopausal patients at high risk for endometrial carcinoma.
(Ref: Radiological Reasoning: Algorithmic Workup of Abnormal Vaginal
Bleeding with Endovaginal Sonography and Sonohysterography. AJR
[id number]; 191:S68

Right ovary

Measurements: 3.9 x 1.8 x 2.8 cm. Normal appearance/no adnexal mass.

Left ovary

Measurements: Removed according to prior report.

Other findings

No abnormal free fluid.
IMPRESSION: 1. Thickened endometrium at 20 mm. If bleeding remains unresponsive
to hormonal or medical therapy, focal lesion work-up with
sonohysterogram should be considered. Endometrial biopsy should also
be considered in pre-menopausal patients at high risk for
endometrial carcinoma. (Ref: Radiological Reasoning: Algorithmic
Workup of Abnormal Vaginal Bleeding with Endovaginal Sonography and
Sonohysterography. AJR [id number]; 191:S68-73)
2. Uterine fibroids as described.

## 2023-09-24 DIAGNOSIS — Z124 Encounter for screening for malignant neoplasm of cervix: Secondary | ICD-10-CM | POA: Diagnosis not present

## 2023-10-16 DIAGNOSIS — L659 Nonscarring hair loss, unspecified: Secondary | ICD-10-CM | POA: Diagnosis not present
